# Patient Record
Sex: Male | Born: 1975 | Race: White | Hispanic: No | Marital: Single
Health system: Midwestern US, Academic
[De-identification: ages and names within clinical notes are randomized; demographics above are authoritative.]

---

## 2021-05-10 ENCOUNTER — Encounter: Admit: 2021-05-10 | Discharge: 2021-05-10 | Payer: BC Managed Care – PPO

## 2021-05-10 NOTE — Telephone Encounter
05/10/21 - Records have been requested per work que task / sjg  _____________________________    Please request additional records from PCP, Dr. Erskine Emery - Ph: [phone]; Fax: [phone]     and EKG from [facility] Ph: [phone]. (F) [phone]

## 2021-05-13 ENCOUNTER — Encounter: Admit: 2021-05-13 | Discharge: 2021-05-13 | Payer: BC Managed Care – PPO

## 2021-05-13 DIAGNOSIS — K219 Gastro-esophageal reflux disease without esophagitis: Secondary | ICD-10-CM

## 2021-05-13 DIAGNOSIS — E785 Hyperlipidemia, unspecified: Secondary | ICD-10-CM

## 2021-05-13 DIAGNOSIS — Z720 Tobacco use: Secondary | ICD-10-CM

## 2021-05-13 DIAGNOSIS — E669 Obesity, unspecified: Secondary | ICD-10-CM

## 2021-05-13 DIAGNOSIS — I10 Essential (primary) hypertension: Secondary | ICD-10-CM

## 2021-05-15 ENCOUNTER — Encounter: Admit: 2021-05-15 | Discharge: 2021-05-15 | Payer: BC Managed Care – PPO

## 2021-05-15 DIAGNOSIS — E669 Obesity, unspecified: Secondary | ICD-10-CM

## 2021-05-15 DIAGNOSIS — E785 Hyperlipidemia, unspecified: Secondary | ICD-10-CM

## 2021-05-15 DIAGNOSIS — K219 Gastro-esophageal reflux disease without esophagitis: Secondary | ICD-10-CM

## 2021-05-15 DIAGNOSIS — I10 Essential (primary) hypertension: Secondary | ICD-10-CM

## 2021-05-15 DIAGNOSIS — Z720 Tobacco use: Secondary | ICD-10-CM

## 2021-05-16 ENCOUNTER — Encounter: Admit: 2021-05-16 | Discharge: 2021-05-16 | Payer: BC Managed Care – PPO

## 2021-05-16 DIAGNOSIS — E785 Hyperlipidemia, unspecified: Secondary | ICD-10-CM

## 2021-05-16 DIAGNOSIS — E669 Obesity, unspecified: Secondary | ICD-10-CM

## 2021-05-16 DIAGNOSIS — I10 Essential (primary) hypertension: Secondary | ICD-10-CM

## 2021-05-16 DIAGNOSIS — Z720 Tobacco use: Secondary | ICD-10-CM

## 2021-05-16 DIAGNOSIS — Z136 Encounter for screening for cardiovascular disorders: Secondary | ICD-10-CM

## 2021-05-16 DIAGNOSIS — K219 Gastro-esophageal reflux disease without esophagitis: Secondary | ICD-10-CM

## 2021-05-16 DIAGNOSIS — E6609 Other obesity due to excess calories: Secondary | ICD-10-CM

## 2021-05-16 MED ORDER — BUPROPION SR 150 MG PO TBSR
150 mg | ORAL_TABLET | Freq: Two times a day (BID) | ORAL | 0 refills | Status: AC
Start: 2021-05-16 — End: ?

## 2021-05-16 NOTE — Patient Instructions
Thank you for visiting our office today.    We would like to make the following medication adjustments:      Start taking Wellbutrin 150 mg. For the first three days take one tablet daily. After day three take one tablet daily for the next 7-12 weeks.        Otherwise continue the same medications as you have been doing.          We will be pursuing the following tests after your appointment today:       Orders Placed This Encounter    ECG 12-LEAD     Echo and Cardiac calcium score    We will plan to see you back in 3 months.  Please call us in the meantime with any questions or concerns.        Please allow 5-7 business days for our providers to review your results. All normal results will go to MyChart. If you do not have Mychart, it is strongly recommended to get this so you can easily view all your results. If you do not have mychart, we will attempt to call you once with normal lab and testing results. If we cannot reach you by phone with normal results, we will send you a letter.  If you have not heard the results of your testing after one week please give [place] a call.       Your Cardiovascular Medicine [place] Team Brett Canales, Pilar Jarvis and [place])  phone number is [phone].

## 2021-05-16 NOTE — Progress Notes
Date of Service: 05/16/2021    Frederick Jacobson is a 45 y.o. white  male.       HPI     Frederick Jacobson is a 45 y.o. white  male history of hypertension diagnosed approximately 2 years ago, currently treated with a combination of an ACE inhibitor and beta-blocker, continues to have diastolic blood pressure, shortness of breath, occasional heart palpitations, elevated triglycerides, obesity, history of acid reflux, status post hiatal hernia repair.    Patient was diagnosed with hypertension approximately 2 years ago, he is currently on an ACE inhibitor and beta-blocker.  He did have in the past episodes when the blood pressure was elevated, and patient experience a headache, tension in the back of the head.  He was also on amlodipine in addition to the current regimen but that was subsequently discontinued.    Patient does not report symptoms of chest pain.  He does report occasional shortness of breath and heart palpitations, at times he thinks that his heart rate is higher than the 100 bpm.  These episodes are not frequent.    Family history: Hypertension, myocardial infarction (grandparents and uncles).    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  He does smoke a pack and a half of cigarettes a day and he has done so since he was a teenager, he drinks alcohol occasionally.         Vitals:    05/16/21 1455 05/16/21 1513   BP: (!) 122/92 (!) 124/90   BP Source: Arm, Left Upper Arm, Right Upper   Pulse: 88    SpO2: 97%    O2 Percent: 97 %    O2 Device: None (Room air)    PainSc: Zero    Weight: 100 kg (220 lb 6.4 oz)    Height: 175.3 cm (5' 9)      Body mass index is 32.55 kg/m?Marland Kitchen     Past Medical History  Patient Active Problem List    Diagnosis Date Noted   ? Primary hypertension 05/13/2021   ? Hyperlipemia 05/13/2021   ? GERD (gastroesophageal reflux disease) 05/13/2021   ? Obesity 05/13/2021   ? Tobacco abuse 05/13/2021         Review of Systems   Constitutional: Positive for malaise/fatigue and weight gain.   HENT: Positive for congestion and stridor.    Eyes: Negative.    Cardiovascular: Positive for dyspnea on exertion and syncope.   Respiratory: Positive for cough, shortness of breath, sputum production and wheezing.    Endocrine: Negative.    Hematologic/Lymphatic: Negative.    Skin: Negative.    Musculoskeletal: Positive for myalgias and neck pain.   Gastrointestinal: Positive for bloating, abdominal pain, constipation, diarrhea, flatus and heartburn.   Genitourinary: Negative.    Neurological: Positive for dizziness and headaches.   Psychiatric/Behavioral: Negative.    Allergic/Immunologic: Positive for environmental allergies.       Physical Exam  General Appearance: normal in appearance  Skin: warm, moist, no ulcers or xanthomas  Eyes: conjunctivae and lids normal, pupils are equal and round  Lips & Oral Mucosa: no pallor or cyanosis  Neck Veins: neck veins are flat, neck veins are not distended  Chest Inspection: chest is normal in appearance  Respiratory Effort: breathing comfortably, no respiratory distress  Auscultation/Percussion: lungs clear to auscultation, no rales or rhonchi, no wheezing  Cardiac Rhythm: regular rhythm and normal rate  Cardiac Auscultation: S1, S2 normal, no rub, no gallop  Murmurs: no  murmur  Carotid Arteries: normal carotid upstroke bilaterally, no bruit  Lower Extremity Edema: no lower extremity edema  Abdominal Exam: soft, non-tender, no masses, bowel sounds normal  Liver & Spleen: no organomegaly  Language and Memory: patient responsive and seems to comprehend information  Neurologic Exam: neurological assessment grossly intact      Cardiovascular Studies  Twelve-lead EKG demonstrates normal sinus rhythm, ventricular rate 80 bpm, no axis deviation, no ST segment T wave changes.    Cardiovascular Health Factors  Vitals BP Readings from Last 3 Encounters:   05/16/21 (!) 124/90     Wt Readings from Last 3 Encounters:   05/16/21 100 kg (220 lb 6.4 oz) BMI Readings from Last 3 Encounters:   05/16/21 32.55 kg/m?      Smoking Social History     Tobacco Use   Smoking Status Current Every Day Smoker   ? Packs/day: 1.50   ? Types: Cigarettes   Smokeless Tobacco Never Used      Lipid Profile Cholesterol   Date Value Ref Range Status   05/06/2021 200  Final     HDL   Date Value Ref Range Status   05/06/2021 38 (L) >40 Final     LDL   Date Value Ref Range Status   05/06/2021 100  Final     Triglycerides   Date Value Ref Range Status   05/06/2021 312 (H) <150 Final      Blood Sugar No results found for: HGBA1C  Glucose   Date Value Ref Range Status   03/16/2021 97  Final          Problems Addressed Today  Encounter Diagnoses   Name Primary?   ? Screening for heart disease Yes   ? Hyperlipidemia, unspecified hyperlipidemia type    ? Primary hypertension    ? Gastroesophageal reflux disease, unspecified whether esophagitis present    ? Class 1 obesity due to excess calories without serious comorbidity with body mass index (BMI) of 31.0 to 31.9 in adult    ? Tobacco abuse        Assessment and Plan     Assessment:    1.  Primary hypertension-currently on an ACE inhibitor and a beta-blocker  2.  Diastolic hypertension  3.  Shortness of breath-this could be secondary to diastolic dysfunction in the setting of hypertension and also to lung issues in the setting of longstanding history of tobacco use  4.  Tobacco use-patient does smoke a pack and half of cigarettes a day since he was a teenager  5.  Family history of hypertensive heart disease and coronary artery disease  6.  History of acid reflux disease and hiatal hernia-patient did undergo surgical intervention  7.  Noncompliant with a low-sodium diet-patient is states that he likes to eat pickles  8.  Obesity-BMI 32.55 kg/m?  9.  Elevated triglycerides-cholesterol profile dated 04/26/2021 demonstrated the following: TC = 200 mg/dL, TG = 960 mg/dL, HDL = 38 mg/dL, LDL = 454 mg/dL.    Plan:    1.  Continue all current medications  2.  I urged the patient to revise his diet and follow a strict low-sodium diet since since this measure alone can lower blood pressure by 5-10 mmHg  3.  Further evaluation with a 2D echo Doppler study and the calcium score-we will follow-up on the results of the test and will call with further recommendations  4.  Wellbutrin-tobacco cessation protocol    Total Time Today was 60 minutes  in the following activities: Preparing to see the patient, Obtaining and/or reviewing separately obtained history, Performing a medically appropriate examination and/or evaluation, Counseling and educating the patient/family/caregiver, Ordering medications, tests, or procedures, Referring and communication with other health care professionals (when not separately reported), Documenting clinical information in the electronic or other health record, Independently interpreting results (not separately reported) and communicating results to the patient/family/caregiver and Care coordination (not separately reported)         Current Medications (including today's revisions)  ? albuterol sulfate (PROAIR HFA) 90 mcg/actuation HFA aerosol inhaler Inhale 2 puffs by mouth into the lungs daily. Shake well before use.   ? lactobacillus rhamnosus (GG) (CULTURELLE) 10 billion cell capsule Take 1 capsule by mouth daily.   ? lisinopriL (ZESTRIL) 40 mg tablet Take 1 tablet by mouth daily.   ? metoprolol tartrate 25 mg tablet Take 1 tablet by mouth twice daily.   ? omeprazole DR (PRILOSEC) 40 mg capsule Take 1 capsule by mouth daily.   ? rosuvastatin (CRESTOR) 5 mg tablet Take 1 tablet by mouth daily.

## 2021-05-27 ENCOUNTER — Encounter: Admit: 2021-05-27 | Discharge: 2021-05-27 | Payer: BC Managed Care – PPO

## 2021-05-27 ENCOUNTER — Ambulatory Visit: Admit: 2021-05-27 | Discharge: 2021-05-27 | Payer: BC Managed Care – PPO

## 2021-05-27 DIAGNOSIS — I10 Essential (primary) hypertension: Secondary | ICD-10-CM

## 2021-05-27 DIAGNOSIS — E785 Hyperlipidemia, unspecified: Secondary | ICD-10-CM

## 2021-05-27 DIAGNOSIS — Z136 Encounter for screening for cardiovascular disorders: Secondary | ICD-10-CM

## 2021-05-28 ENCOUNTER — Encounter: Admit: 2021-05-28 | Discharge: 2021-05-28 | Payer: BC Managed Care – PPO

## 2021-05-28 NOTE — Telephone Encounter
Results and recommendations called to patient. Patient has no questions at this time.

## 2021-05-28 NOTE — Telephone Encounter
-----   Message from Rosaura Carpenter, RN sent at 05/28/2021  7:37 AM CDT -----    ----- Message -----  From: Dorris Fetch, MD  Sent: 05/27/2021   8:59 PM CDT  To: [facility] Nurse [place]    Please call this patient when you have a chance and inform him that the echo looks fine, nl heart fct. Thank you!     ----- Message -----  From: Leilani Merl, MD  Sent: 05/27/2021   1:49 PM CDT  To: Dorris Fetch, MD

## 2021-08-12 ENCOUNTER — Encounter: Admit: 2021-08-12 | Discharge: 2021-08-12 | Payer: BC Managed Care – PPO

## 2021-08-12 MED ORDER — BUPROPION SR 150 MG PO TBSR
150 mg | ORAL_TABLET | Freq: Two times a day (BID) | ORAL | 0 refills
Start: 2021-08-12 — End: ?

## 2021-08-22 ENCOUNTER — Encounter: Admit: 2021-08-22 | Discharge: 2021-08-22 | Payer: BC Managed Care – PPO

## 2021-08-22 ENCOUNTER — Ambulatory Visit: Admit: 2021-08-22 | Discharge: 2021-08-23 | Payer: BC Managed Care – PPO

## 2021-08-22 DIAGNOSIS — K219 Gastro-esophageal reflux disease without esophagitis: Secondary | ICD-10-CM

## 2021-08-22 DIAGNOSIS — E6609 Other obesity due to excess calories: Secondary | ICD-10-CM

## 2021-08-22 DIAGNOSIS — I10 Essential (primary) hypertension: Secondary | ICD-10-CM

## 2021-08-22 DIAGNOSIS — R931 Abnormal findings on diagnostic imaging of heart and coronary circulation: Secondary | ICD-10-CM

## 2021-08-22 DIAGNOSIS — E785 Hyperlipidemia, unspecified: Secondary | ICD-10-CM

## 2021-08-22 DIAGNOSIS — E7800 Pure hypercholesterolemia, unspecified: Secondary | ICD-10-CM

## 2021-08-22 DIAGNOSIS — Z720 Tobacco use: Secondary | ICD-10-CM

## 2021-08-22 DIAGNOSIS — E669 Obesity, unspecified: Secondary | ICD-10-CM

## 2021-08-22 NOTE — Progress Notes
Date of Service: 08/22/2021    Frederick Jacobson is a 45 y.o. white  male.       HPI        Frederick Jacobson is a 45 y.o. 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    Patient states that he woke up this last Sunday, 08/16/2001, he was not feeling well, he did take his blood pressure and it was 143/100 mmHg.  He did take his medications (lisinopril and metoprolol), and by the time he reached the ER his blood pressure was 120/80 mmHg.    Patient also reported having neck pain radiating to his back and left upper extremity, it is probably related to osteoarthritis.    Patient was evaluated with the following:    1.  2D echo Doppler study dated 05/27/2021- demonstrated normal LVEF, no significant valvular abnormalities, the sinuses of Valsalva measured within normal range (upper limits 4.0 cm, they measured at 3.9 cm).  2.  Chest CT, coronary calcium score- this performed on 05/27/2021, it was 6.1 and entirely located in the LAD.    Patient has not had any chest pain, no heart palpitations.    He continues to smoke.  He did take Wellbutrin and he thinks that helped, however he currently is not taking this medication.    He also gained approximately 8 pounds since last seen on 05/27/2021.         Vitals:    08/22/21 1254   BP: (!) 132/96   BP Source: Arm, Left Upper   Pulse: 84   SpO2: 96%   O2 Percent: 96 %   O2 Device: None (Room air)   PainSc: Two   Weight: 102.9 kg (226 lb 12.8 oz)   Height: 175.3 cm (5' 9)     Body mass index is 33.49 kg/m?Marland Kitchen     Past Medical History  Patient Active Problem List    Diagnosis Date Noted   ? Primary hypertension 05/13/2021   ? Hyperlipemia 05/13/2021   ? GERD (gastroesophageal reflux disease) 05/13/2021   ? Obesity 05/13/2021   ? Tobacco abuse 05/13/2021         Review of Systems   Constitutional: Negative.   HENT: Negative.    Eyes: Negative.    Cardiovascular: Positive for dyspnea on exertion.   Respiratory: Positive for shortness of breath.    Endocrine: Negative.    Hematologic/Lymphatic: Negative.    Skin: Negative.    Musculoskeletal: Positive for back pain, joint pain, myalgias, neck pain and stiffness.   Gastrointestinal: Negative.    Genitourinary: Negative.    Neurological: Positive for dizziness, light-headedness, numbness and paresthesias.   Psychiatric/Behavioral: Negative.    Allergic/Immunologic: Negative.        Physical Exam  General Appearance: normal in appearance  Skin: warm, moist, no ulcers or xanthomas  Eyes: conjunctivae and lids normal, pupils are equal and round  Lips & Oral Mucosa: no pallor or cyanosis  Neck Veins: neck veins are flat, neck veins are not distended  Chest Inspection: chest is normal in appearance  Respiratory Effort: breathing comfortably, no respiratory distress  Auscultation/Percussion: lungs clear to auscultation, no rales or rhonchi, no wheezing  Cardiac Rhythm: regular rhythm and normal rate  Cardiac Auscultation: S1, S2 normal, no rub, no gallop  Murmurs: no murmur  Carotid Arteries: normal carotid upstroke  bilaterally, no bruit  Lower Extremity Edema: no lower extremity edema  Abdominal Exam: soft, non-tender, no masses, bowel sounds normal  Liver & Spleen: no organomegaly  Language and Memory: patient responsive and seems to comprehend information  Neurologic Exam: neurological assessment grossly intact      Cardiovascular Studies      Cardiovascular Health Factors  Vitals BP Readings from Last 3 Encounters:   08/22/21 (!) 132/96   05/27/21 110/77   05/16/21 (!) 124/90     Wt Readings from Last 3 Encounters:   08/22/21 102.9 kg (226 lb 12.8 oz)   05/27/21 99 kg (218 lb 4.1 oz)   05/16/21 100 kg (220 lb 6.4 oz)     BMI Readings from Last 3 Encounters:   08/22/21 33.49 kg/m?   05/27/21 32.23 kg/m?   05/16/21 32.55 kg/m? Smoking Social History     Tobacco Use   Smoking Status Every Day   ? Packs/day: 2.00   ? Types: Cigarettes   Smokeless Tobacco Never      Lipid Profile Cholesterol   Date Value Ref Range Status   05/06/2021 200  Final     HDL   Date Value Ref Range Status   05/06/2021 38 (L) >40 Final     LDL   Date Value Ref Range Status   05/06/2021 100  Final     Triglycerides   Date Value Ref Range Status   05/06/2021 312 (H) <150 Final      Blood Sugar No results found for: HGBA1C  Glucose   Date Value Ref Range Status   03/16/2021 97  Final          Problems Addressed Today  Encounter Diagnoses   Name Primary?   ? Pure hypercholesterolemia Yes   ? Primary hypertension    ? Gastroesophageal reflux disease, unspecified whether esophagitis present    ? Class 1 obesity due to excess calories without serious comorbidity with body mass index (BMI) of 31.0 to 31.9 in adult    ? Tobacco abuse        Assessment and Plan     Assessment:    1.  Primary hypertension- suboptimally controlled  Currently on lisinopril metoprolol  2.  Diastolic hypertension  3.  Emergency room evaluation on 08/16/2021  Prior to this emergency room visit patient was not feeling well, his blood pressure was 142/100 mmHg  4.  Mildly elevated coronary calcium score  It was 6.1 located in the LAD, coronary CTA dated 05/27/2021  5.  Obesity-patient's BMI is 33.49 kg/m?  6.  Continued tobacco use  7.  Perhaps cervical spine osteoarthritis-patient reports symptoms of numbness radiating to the back and left upper extremity  8.  Family history of hypertensive heart disease and CAD  9.  Mixed hyperlipidemia    Plan:    1.  I asked the patient to split the lisinopril and 20 mg p.o. twice daily  2.  Continue metoprolol 25 twice daily  3.  I did urged the patient to continue every effort to quit use of tobacco products  4.  Follow a strict low-sodium diet  5.  I suggested weight reduction  6.  Further neurological evaluation for what appears to be cervical spine osteoarthritis  7.  Follow-up office visit in 6 months      Total Time Today was 40 minutes in the following activities: Preparing to see the patient, Obtaining and/or reviewing separately obtained history, Performing a medically appropriate examination and/or evaluation, Counseling  and educating the patient/family/caregiver, Ordering medications, tests, or procedures, Referring and communication with other health care professionals (when not separately reported), Documenting clinical information in the electronic or other health record, Independently interpreting results (not separately reported) and communicating results to the patient/family/caregiver and Care coordination (not separately reported)         Current Medications (including today's revisions)  ? albuterol sulfate (PROAIR HFA) 90 mcg/actuation HFA aerosol inhaler Inhale 2 puffs by mouth into the lungs as Needed. Shake well before use.   ? buPROPion HCL SR (WELLBUTRIN SR) 150 mg tablet, 12 hr sustained-release Take one tablet by mouth twice daily.   ? lisinopriL (ZESTRIL) 40 mg tablet Take 1 tablet by mouth daily.   ? metoprolol tartrate 25 mg tablet Take 1 tablet by mouth twice daily.   ? omeprazole DR (PRILOSEC) 40 mg capsule Take 1 capsule by mouth daily.   ? rosuvastatin (CRESTOR) 5 mg tablet Take 1 tablet by mouth daily.

## 2022-07-07 IMAGING — CR [ID]
5 series · 5 of 5 positions shown · non-contrast
Comparison: none

[cspine lat]
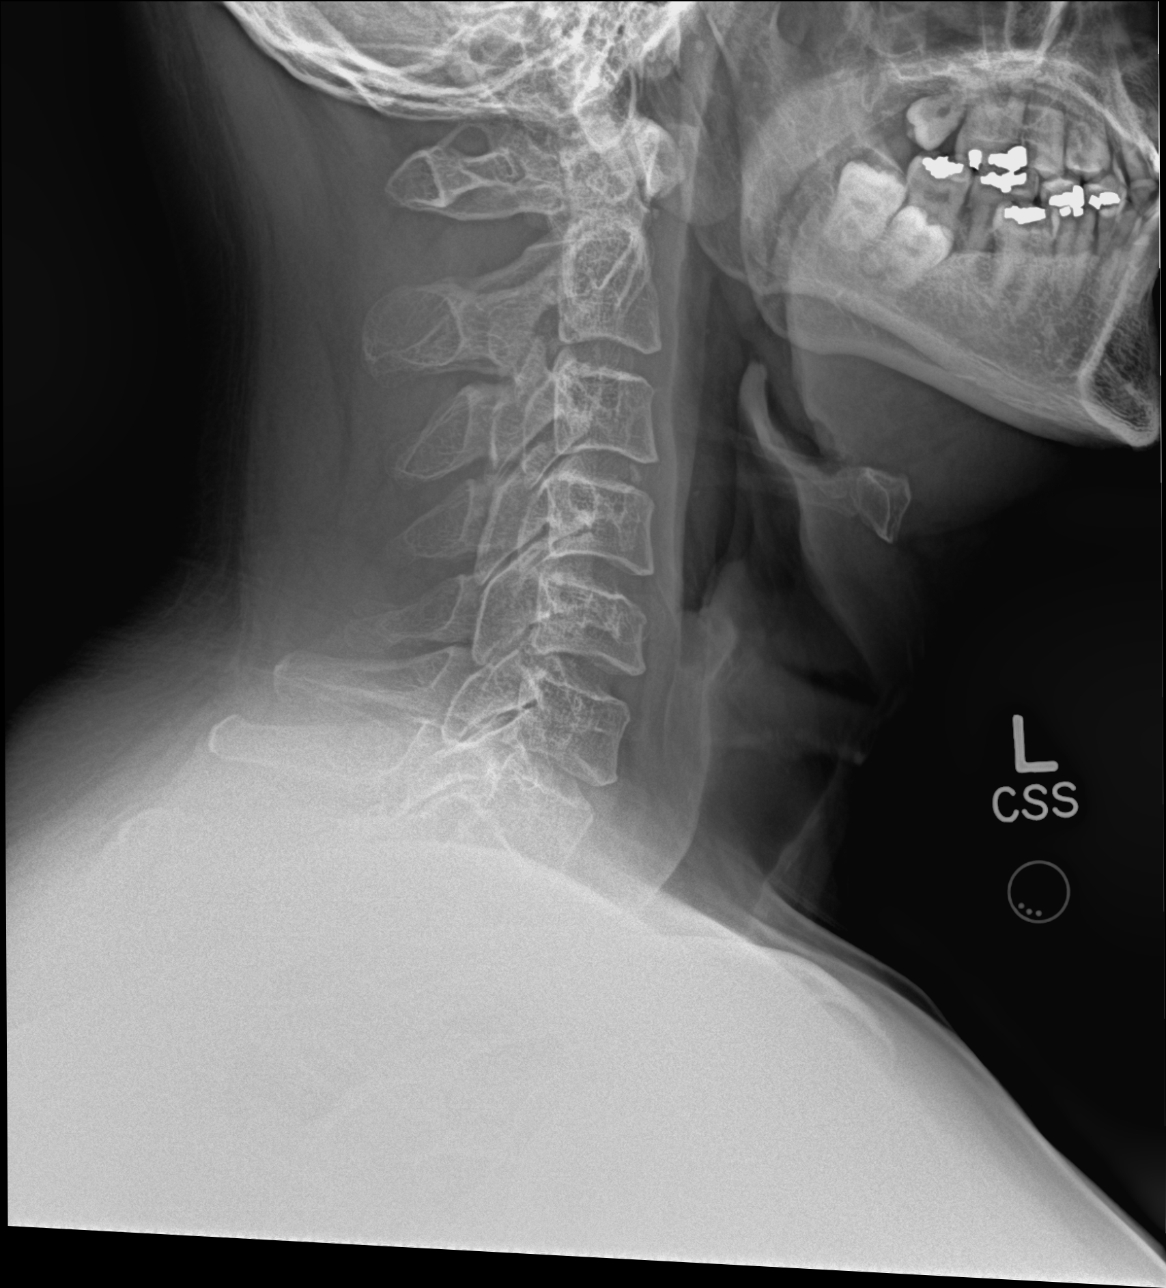

[cspine ap]
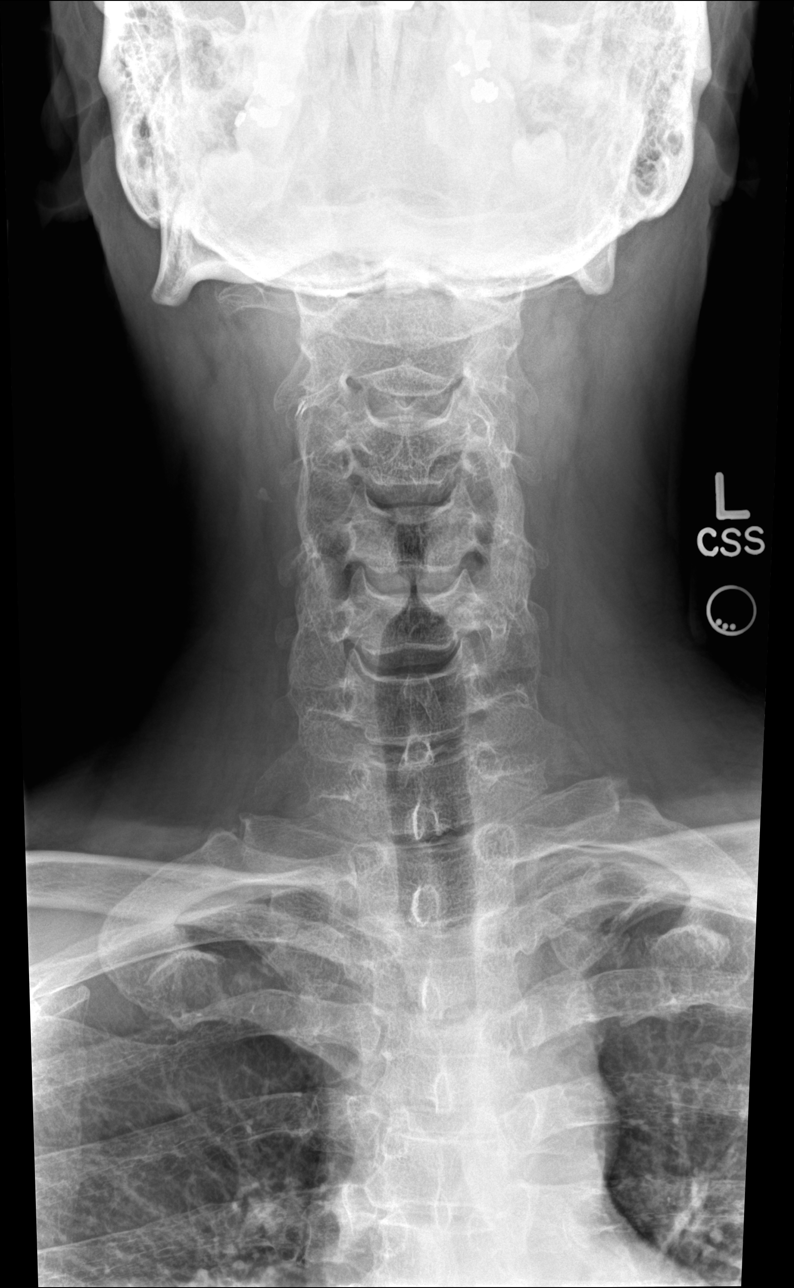

[cspine obl (1 of 2)]
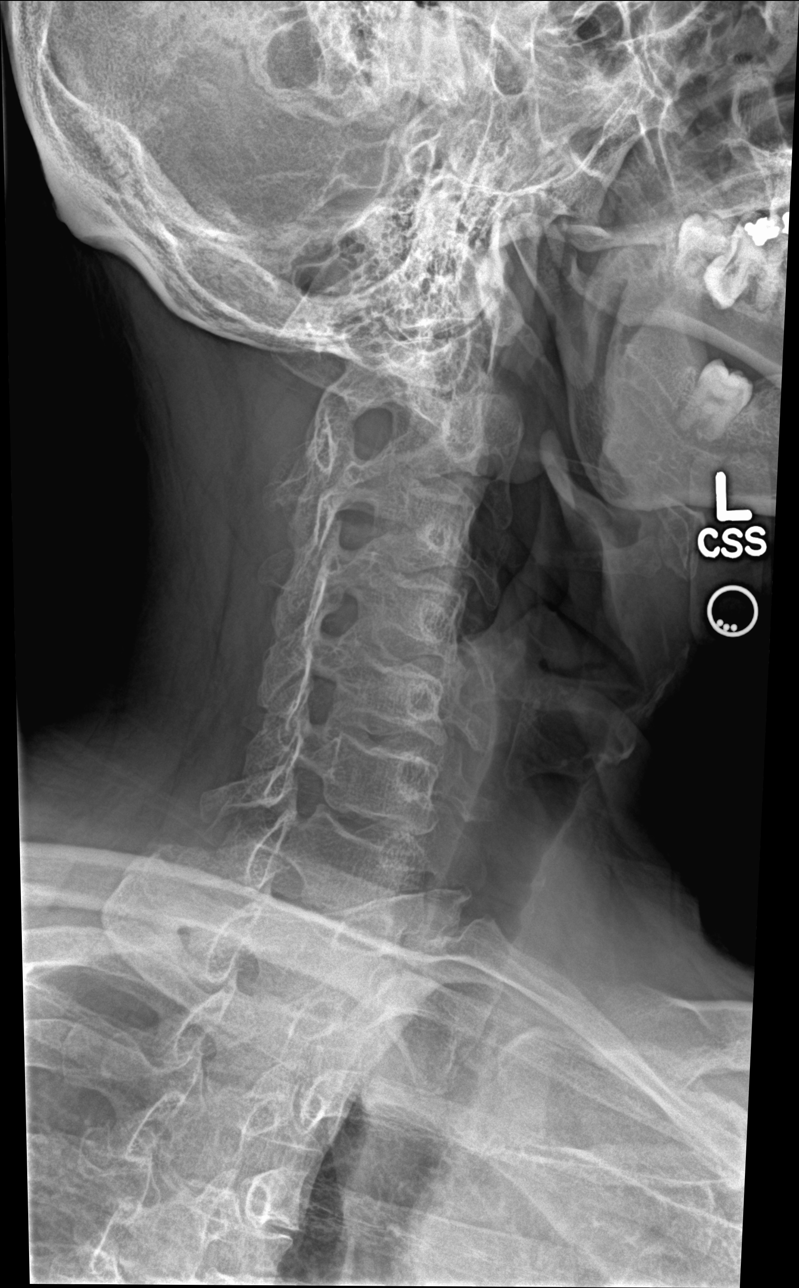

[cspine obl (2 of 2)]
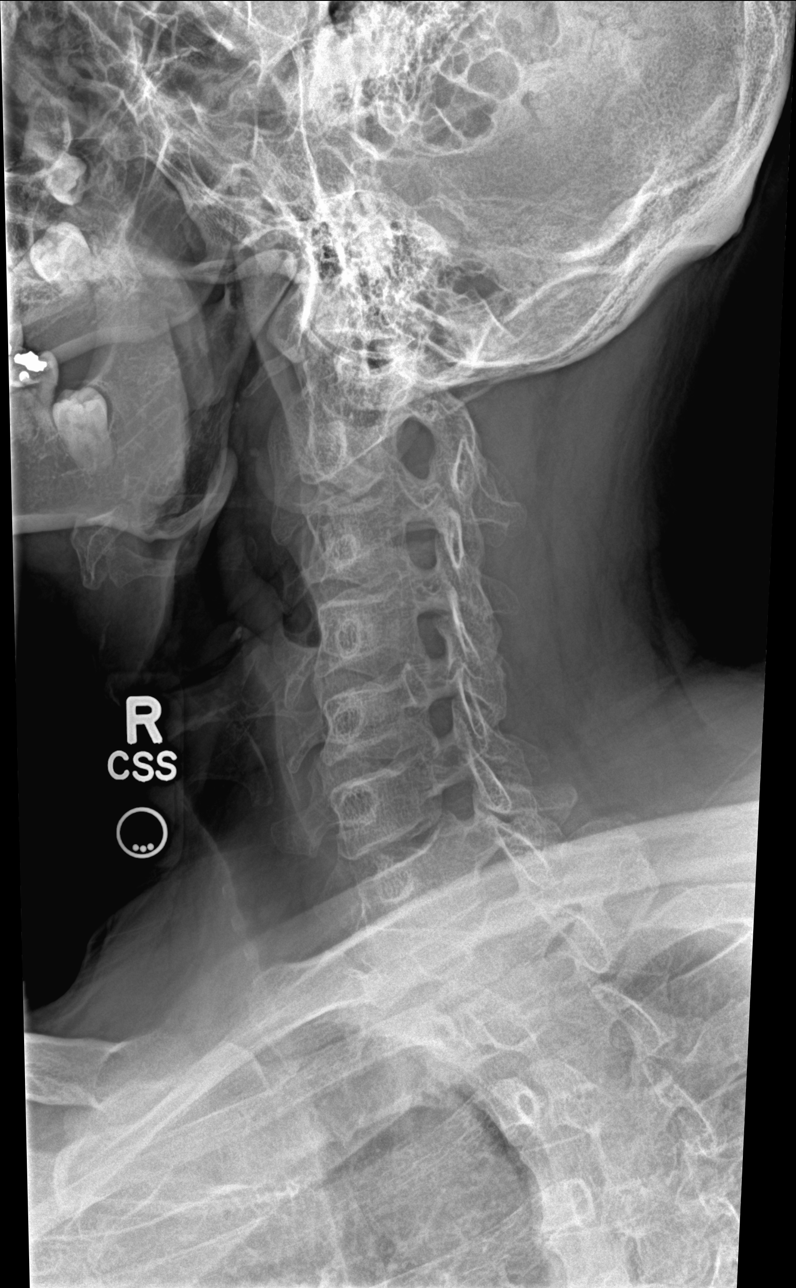

[cspine odontoid]
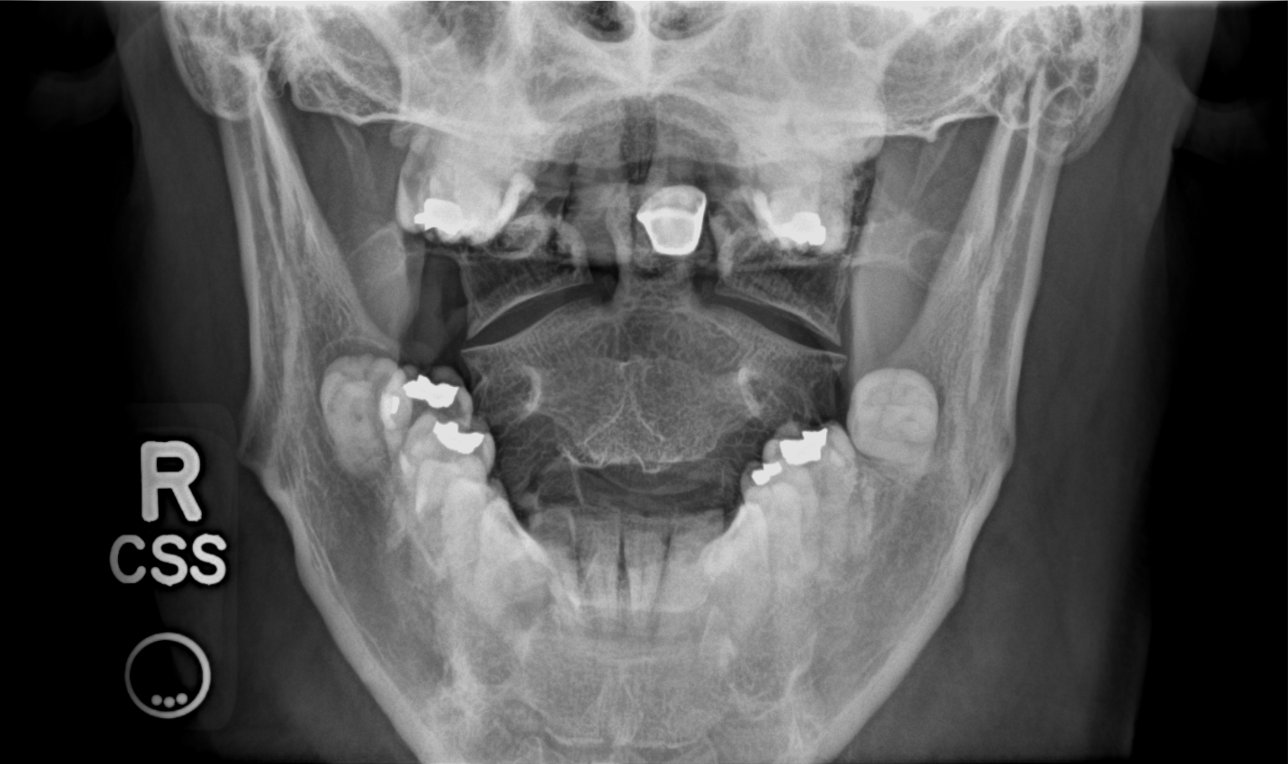

[5 of 5 positions shown; findings below may reference images not displayed]

DIAGNOSTIC STUDIES

EXAM

XR cervical spine 5V

INDICATION

Left cervical radiculopathy.
Pressure on posterior/ lateral head on right side x 1 year. Pain on left side of neck, shoulder and
has numbness and tingling in hands and fingers.CS

TECHNIQUE

Five views

COMPARISONS

None

FINDINGS

There is no fracture or listhesis. The intervertebral disc heights are maintained. Neural foramina
are grossly patent. Odontoid is partially obscured but appears intact. The prevertebral soft tissues
are unremarkable.

IMPRESSION

There are no acute findings of the cervical spine.

Tech Notes:

Pressure on posterior/ lateral head on right side x 1 year. Pain on left side of neck, shoulder and
has numbness and tingling in hands and fingers.CS
